# Patient Record
Sex: Female | Born: 1960 | Race: White | Hispanic: No | Marital: Married | Smoking: Current every day smoker
Health system: Southern US, Community
[De-identification: ages and names within clinical notes are randomized; demographics above are authoritative.]

## PROBLEM LIST (undated history)

## (undated) HISTORY — PX: TUBAL LIGATION: SHX77

## (undated) HISTORY — PX: OTHER SURGICAL HISTORY: SHX169

---

## 1998-01-27 ENCOUNTER — Encounter: Admission: RE | Admit: 1998-01-27 | Discharge: 1998-01-27 | Payer: Self-pay | Admitting: Family Medicine

## 1998-02-03 ENCOUNTER — Encounter: Admission: RE | Admit: 1998-02-03 | Discharge: 1998-02-03 | Payer: Self-pay | Admitting: Family Medicine

## 1998-04-08 ENCOUNTER — Emergency Department (HOSPITAL_COMMUNITY): Admission: EM | Admit: 1998-04-08 | Discharge: 1998-04-08 | Payer: Self-pay | Admitting: Emergency Medicine

## 1998-04-21 ENCOUNTER — Encounter: Payer: Self-pay | Admitting: Family Medicine

## 1998-04-21 ENCOUNTER — Encounter: Admission: RE | Admit: 1998-04-21 | Discharge: 1998-04-21 | Payer: Self-pay | Admitting: Family Medicine

## 1998-04-21 ENCOUNTER — Ambulatory Visit (HOSPITAL_COMMUNITY): Admission: RE | Admit: 1998-04-21 | Discharge: 1998-04-21 | Payer: Self-pay | Admitting: Family Medicine

## 1998-05-26 ENCOUNTER — Encounter: Admission: RE | Admit: 1998-05-26 | Discharge: 1998-05-26 | Payer: Self-pay | Admitting: Sports Medicine

## 1998-08-04 ENCOUNTER — Encounter: Admission: RE | Admit: 1998-08-04 | Discharge: 1998-08-04 | Payer: Self-pay | Admitting: Family Medicine

## 1998-08-11 ENCOUNTER — Encounter: Admission: RE | Admit: 1998-08-11 | Discharge: 1998-08-11 | Payer: Self-pay | Admitting: Sports Medicine

## 1999-01-19 ENCOUNTER — Encounter: Admission: RE | Admit: 1999-01-19 | Discharge: 1999-01-19 | Payer: Self-pay | Admitting: Family Medicine

## 1999-02-01 ENCOUNTER — Encounter: Admission: RE | Admit: 1999-02-01 | Discharge: 1999-02-01 | Payer: Self-pay | Admitting: Family Medicine

## 2002-12-05 ENCOUNTER — Emergency Department (HOSPITAL_COMMUNITY): Admission: EM | Admit: 2002-12-05 | Discharge: 2002-12-05 | Payer: Self-pay | Admitting: Emergency Medicine

## 2002-12-17 ENCOUNTER — Encounter: Admission: RE | Admit: 2002-12-17 | Discharge: 2002-12-17 | Payer: Self-pay | Admitting: Family Medicine

## 2003-01-07 ENCOUNTER — Encounter: Admission: RE | Admit: 2003-01-07 | Discharge: 2003-01-07 | Payer: Self-pay | Admitting: Family Medicine

## 2003-01-07 ENCOUNTER — Encounter: Payer: Self-pay | Admitting: Family Medicine

## 2004-03-23 ENCOUNTER — Encounter: Admission: RE | Admit: 2004-03-23 | Discharge: 2004-03-23 | Payer: Self-pay | Admitting: Family Medicine

## 2004-03-23 ENCOUNTER — Encounter: Payer: Self-pay | Admitting: Family Medicine

## 2005-11-29 ENCOUNTER — Encounter (INDEPENDENT_AMBULATORY_CARE_PROVIDER_SITE_OTHER): Payer: Self-pay | Admitting: *Deleted

## 2005-11-29 LAB — CONVERTED CEMR LAB

## 2005-12-27 ENCOUNTER — Ambulatory Visit: Payer: Self-pay | Admitting: Family Medicine

## 2006-03-07 ENCOUNTER — Ambulatory Visit: Payer: Self-pay | Admitting: Family Medicine

## 2006-03-29 ENCOUNTER — Encounter: Admission: RE | Admit: 2006-03-29 | Discharge: 2006-03-29 | Payer: Self-pay | Admitting: Family Medicine

## 2006-09-28 DIAGNOSIS — F17200 Nicotine dependence, unspecified, uncomplicated: Secondary | ICD-10-CM

## 2006-09-28 DIAGNOSIS — F79 Unspecified intellectual disabilities: Secondary | ICD-10-CM | POA: Insufficient documentation

## 2006-09-29 ENCOUNTER — Encounter (INDEPENDENT_AMBULATORY_CARE_PROVIDER_SITE_OTHER): Payer: Self-pay | Admitting: *Deleted

## 2006-10-09 ENCOUNTER — Telehealth: Payer: Self-pay | Admitting: *Deleted

## 2006-10-10 ENCOUNTER — Ambulatory Visit: Payer: Self-pay | Admitting: Family Medicine

## 2006-10-24 ENCOUNTER — Telehealth: Payer: Self-pay | Admitting: *Deleted

## 2006-11-01 ENCOUNTER — Encounter: Payer: Self-pay | Admitting: Family Medicine

## 2006-11-07 ENCOUNTER — Ambulatory Visit: Payer: Self-pay | Admitting: Family Medicine

## 2006-11-07 DIAGNOSIS — K029 Dental caries, unspecified: Secondary | ICD-10-CM | POA: Insufficient documentation

## 2006-11-07 DIAGNOSIS — M545 Low back pain: Secondary | ICD-10-CM

## 2006-12-12 ENCOUNTER — Ambulatory Visit: Payer: Self-pay | Admitting: Family Medicine

## 2007-11-27 ENCOUNTER — Ambulatory Visit: Payer: Self-pay | Admitting: Family Medicine

## 2007-11-27 DIAGNOSIS — R5383 Other fatigue: Secondary | ICD-10-CM

## 2007-11-27 DIAGNOSIS — R609 Edema, unspecified: Secondary | ICD-10-CM | POA: Insufficient documentation

## 2007-11-27 DIAGNOSIS — R5381 Other malaise: Secondary | ICD-10-CM | POA: Insufficient documentation

## 2007-11-27 LAB — CONVERTED CEMR LAB
ALT: 12 units/L (ref 0–35)
AST: 15 units/L (ref 0–37)
Albumin: 4.3 g/dL (ref 3.5–5.2)
Alkaline Phosphatase: 52 units/L (ref 39–117)
BUN: 11 mg/dL (ref 6–23)
Bilirubin Urine: NEGATIVE
Blood in Urine, dipstick: NEGATIVE
CO2: 22 meq/L (ref 19–32)
Calcium: 9 mg/dL (ref 8.4–10.5)
Chloride: 107 meq/L (ref 96–112)
Creatinine, Ser: 0.67 mg/dL (ref 0.40–1.20)
Direct LDL: 134 mg/dL — ABNORMAL HIGH
Glucose, Bld: 110 mg/dL — ABNORMAL HIGH (ref 70–99)
Glucose, Urine, Semiquant: NEGATIVE
HCT: 45.2 % (ref 36.0–46.0)
Hemoglobin: 14.7 g/dL (ref 12.0–15.0)
Ketones, urine, test strip: NEGATIVE
MCHC: 32.5 g/dL (ref 30.0–36.0)
MCV: 90.6 fL (ref 78.0–100.0)
Nitrite: NEGATIVE
Platelets: 259 10*3/uL (ref 150–400)
Potassium: 3.8 meq/L (ref 3.5–5.3)
Protein, U semiquant: NEGATIVE
RBC: 4.99 M/uL (ref 3.87–5.11)
RDW: 14.2 % (ref 11.5–15.5)
Sodium: 142 meq/L (ref 135–145)
Specific Gravity, Urine: <1.005
TSH: 1.813 microintl units/mL (ref 0.350–5.50)
Total Bilirubin: 1 mg/dL (ref 0.3–1.2)
Total Protein: 6.9 g/dL (ref 6.0–8.3)
Urobilinogen, UA: 0.2
WBC Urine, dipstick: NEGATIVE
WBC: 7.4 10*3/uL (ref 4.0–10.5)
pH: 5.5

## 2008-05-28 ENCOUNTER — Telehealth (INDEPENDENT_AMBULATORY_CARE_PROVIDER_SITE_OTHER): Payer: Self-pay | Admitting: *Deleted

## 2010-06-08 ENCOUNTER — Encounter: Payer: Self-pay | Admitting: Family Medicine

## 2010-06-08 ENCOUNTER — Ambulatory Visit (HOSPITAL_COMMUNITY): Admission: RE | Admit: 2010-06-08 | Discharge: 2010-06-08 | Payer: Self-pay | Admitting: Family Medicine

## 2010-06-08 ENCOUNTER — Ambulatory Visit: Payer: Self-pay | Admitting: Family Medicine

## 2010-06-08 DIAGNOSIS — M25559 Pain in unspecified hip: Secondary | ICD-10-CM

## 2010-06-08 DIAGNOSIS — R079 Chest pain, unspecified: Secondary | ICD-10-CM

## 2010-06-08 LAB — CONVERTED CEMR LAB: Pap Smear: NEGATIVE

## 2010-06-17 ENCOUNTER — Ambulatory Visit: Payer: Self-pay | Admitting: Family Medicine

## 2010-06-17 DIAGNOSIS — K645 Perianal venous thrombosis: Secondary | ICD-10-CM

## 2010-08-29 LAB — CONVERTED CEMR LAB
ALT: <8 units/L (ref 0–35)
AST: 13 units/L (ref 0–37)
Albumin: 4.3 g/dL (ref 3.5–5.2)
Alkaline Phosphatase: 53 units/L (ref 39–117)
BUN: 8 mg/dL (ref 6–23)
CO2: 25 meq/L (ref 19–32)
Calcium: 9.2 mg/dL (ref 8.4–10.5)
Chloride: 107 meq/L (ref 96–112)
Creatinine, Ser: 0.73 mg/dL (ref 0.40–1.20)
Direct LDL: 148 mg/dL — ABNORMAL HIGH
Glucose, Bld: 85 mg/dL (ref 70–99)
HCT: 43.4 % (ref 36.0–46.0)
Hemoglobin: 13.8 g/dL (ref 12.0–15.0)
MCHC: 31.8 g/dL (ref 30.0–36.0)
MCV: 93.7 fL (ref 78.0–100.0)
Platelets: 271 10*3/uL (ref 150–400)
Potassium: 3.8 meq/L (ref 3.5–5.3)
RBC: 4.63 M/uL (ref 3.87–5.11)
RDW: 14.6 % (ref 11.5–15.5)
Sodium: 143 meq/L (ref 135–145)
Total Bilirubin: 0.4 mg/dL (ref 0.3–1.2)
Total Protein: 6.6 g/dL (ref 6.0–8.3)
WBC: 9 10*3/uL (ref 4.0–10.5)

## 2010-08-31 NOTE — Assessment & Plan Note (Signed)
Summary: CPE/KH   Vital Signs:  Patient profile:   50 year old female Height:      59.5 inches Weight:      135 pounds Pulse rate:   64 / minute BP sitting:   116 / 80  (right arm)  Vitals Entered By: Arlyss Repress CMA, (June 08, 2010 1:34 PM) CC: mid chest pain x 2 days. swelling of extremities x few days. Is Patient Diabetic? No Pain Assessment Patient in pain? yes     Location: head Intensity: 10 Onset of pain  x 2 days   Primary Care Provider:  Zachery Dauer MD  CC:  mid chest pain x 2 days. swelling of extremities x few days.Marland Kitchen  History of Present Illness: Here for physical exam.  She reports chest pains that are not associated with activity that wake her up in the night, often present in the morning, lasting days.  Her boyfriend called and expressed worry and mentioned that he has had several heart attacks one recently.    She also talks about headaches, sometimes at the same time as the chest pain.  Her left hip hurts, and catches when she stands.  Her hands are puffy (this has been documented in the past as a chronic problem)  Patients is not a very good historian.  Habits & Providers  Alcohol-Tobacco-Diet     Tobacco Status: current     Tobacco Counseling: to quit use of tobacco products     Cigarette Packs/Day: 1.0  Current Medications (verified): 1)  Ranitidine Hcl 150 Mg Caps (Ranitidine Hcl) .... One Two Times A Day  Allergies (verified): No Known Drug Allergies  Social History: Packs/Day:  1.0  Review of Systems      See HPI  Physical Exam  General:  Looked well, low literacy, moved easily on and off exam table. Eyes:  pupils equal, pupils round, pupils reactive to light, no injection, no iris abnormalities, and no optic disk abnormalities.   Ears:  R ear normal and L ear normal.   Nose:  no external deformity, no external erythema, and no nasal discharge.   Mouth:  edentulous, no lesions Neck:  No deformities, masses, or tenderness  noted. Lungs:  normal respiratory effort and normal breath sounds.   Heart:  normal rate, regular rhythm, and no murmur.    EKG-brady sinus rhythmn Abdomen:  soft, non-tender, normal bowel sounds, no distention, and no masses.   Rectal:  peri-anal reddness, few tags Genitalia:  normal introitus, no external lesions, no vaginal discharge, mucosa pink and moist, no vaginal or cervical lesions, no friaility or hemorrhage, and normal uterus size and position.  anterior vaginal wall weakness with palpation of bladder easily. Msk:  boggy hands, no joint reddness.  Full ROM of UE.  Left hip rotation produced left groin pain.  Right hip rotation without complaint of pain. Neurologic:  cranial nerves II-XII intact, strength normal in all extremities, and gait normal.   Skin:  no suspicious lesions Psych:  low intellictual functioning, happy affect.   Impression & Recommendations:  Problem # 1:  HEALTH MAINTENANCE EXAM (ICD-V70.0) LDL 148, flu shot given, Mammogram due-will address at next visit. Orders: [facility] - Est  40-64 yrs (16109)  Problem # 2:  HIP PAIN, LEFT (ICD-719.45) groin pain, consider Xray if continues to be a problem  Problem # 3:  CHEST PAIN (ICD-786.50) did not fit the picture of cardiac, rather sounded more GI.  Fist step is to add antacid (ranitadine) and  follow up in one month. Orders: 12 Lead EKG (12 Lead EKG) Comp Met-[facility] (29562-13086) Direct LDL-[facility] (57846-96295) CBC-[facility] (28413) [facility] - Est  40-64 yrs (24401)  Complete Medication List: 1)  Ranitidine Hcl 150 Mg Caps (Ranitidine hcl) .... One two times a day  Other Orders: Pap Smear-[facility] (02725-36644) Influenza Vaccine NON MCR (03474)  Patient Instructions: 1)  I think your chest pain is coming from your stomach, take the medicine as prescribed and return in one month with Dr. Sheffield Slider 2)  Your EKG for your heart looked good Prescriptions: RANITIDINE HCL 150 MG CAPS (RANITIDINE HCL) one two times a day Brand medically  necessary #60 x 3   Entered and Authorized by:   Luretha Murphy NP   Signed by:   Luretha Murphy NP on 06/08/2010   Method used:   Print then Give to Patient   RxID:   [id number]    Orders Added: 1)  12 Lead EKG [12 Lead EKG] 2)  Comp Met-[facility] [18841-66063] 3)  Direct LDL-[facility] [83721-81033] 4)  CBC-[facility] [85027] 5)  Pap Smear-[facility] [01601-09323] 6)  Influenza Vaccine NON MCR [00028] 7)  [facility] - Est  40-64 yrs [99396]   Immunizations Administered:  Influenza Vaccine # 1:    Vaccine Type: Fluvax Non-MCR    Site: left deltoid    Mfr: GlaxoSmithKline    Dose: 0.5 ml    Route: IM    Given by: Arlyss Repress CMA,    Exp. Date: 01/29/2011    Lot #: [id number]    VIS given: 02/23/10 version given June 08, 2010.  Flu Vaccine Consent Questions:    Do you have a history of severe allergic reactions to this vaccine? no    Any prior history of allergic reactions to egg and/or gelatin? no    Do you have a sensitivity to the preservative Thimersol? no    Do you have a past history of Guillan-Barre Syndrome? no    Do you currently have an acute febrile illness? no    Have you ever had a severe reaction to latex? no    Vaccine information given and explained to patient? yes    Are you currently pregnant? no   Immunizations Administered:  Influenza Vaccine # 1:    Vaccine Type: Fluvax Non-MCR    Site: left deltoid    Mfr: GlaxoSmithKline    Dose: 0.5 ml    Route: IM    Given by: Arlyss Repress CMA,    Exp. Date: 01/29/2011    Lot #: [id number]    VIS given: 02/23/10 version given June 08, 2010.   Prevention & Chronic Care Immunizations   Influenza vaccine: Fluvax Non-MCR  (06/08/2010)    Tetanus booster: 11/29/2005: Done.   Tetanus booster due: 11/30/2015    Pneumococcal vaccine: Not documented  Other Screening   Pap smear: Done.  (11/29/2005)   Pap smear action/deferral: Ordered  (06/08/2010)   Pap smear due: 11/29/2008    Mammogram: Done.  (03/01/2006)   Mammogram  due: Refused  (11/27/2007)   Smoking status: current  (06/08/2010)   Smoking cessation counseling: yes  (11/27/2007)  Lipids   Total Cholesterol: Not documented   LDL: Not documented   LDL Direct: 134  (11/27/2007)   HDL: Not documented   Triglycerides: Not documented   Nursing Instructions: Pap smear today

## 2010-08-31 NOTE — Assessment & Plan Note (Signed)
Summary: painful knot on bottom,df   Vital Signs:  Patient profile:   50 year old female Height:      59.5 inches Weight:      135.8 pounds BMI:     27.07 Temp:     98.7 degrees F oral Pulse rate:   72 / minute BP sitting:   118 / 80  (right arm)  Vitals Entered By: Renato Battles slade,cma CC: check bottom. boil? Is Patient Diabetic? No Pain Assessment Patient in pain? yes     Location: bottom Intensity: 10 Onset of pain  x few days   Primary Care Provider:  Zachery Dauer MD  CC:  check bottom. boil?Marland Kitchen  History of Present Illness: 50 year old female presents to clinic for evaulation lesion on rectum.  She has a history of external hemorrhoids which she says have been opened in the past.  Lesion is painful x 3 days and interferes with sitting and sleep.  Has not taken any medications for pain.  Still c/o intermittent chest pain which was evaulated at last visit, last occurred 6 days ago.  EKG was normal except for sinus brady.  Patient currently taking Ranitidine, and denies any abdominal pain, reflux, burning sensation in throat.  She has seen small amount of blood on the toilet paper. Hasn't been constipated.    Habits & Providers  Alcohol-Tobacco-Diet     Tobacco Status: current     Tobacco Counseling: to quit use of tobacco products  Current Medications (verified): 1)  Ranitidine Hcl 150 Mg Caps (Ranitidine Hcl) .... One Two Times A Day  Allergies (verified): No Known Drug Allergies  Review of Systems  The patient denies severe indigestion/heartburn.         Endorses rectal pain and intermittent chest pain.    Physical Exam  General:  Looked well, low literacy, moved easily on and off exam table. Lungs:  normal respiratory effort and normal breath sounds.   Heart:  normal rate, regular rhythm, and no murmur.     Abdomen:  soft, non-tender, normal bowel sounds, no distention, and no masses.   Rectal:  2 cm thrombosed external hemorrhoid measuring  ~1.5cm wide, R side   tenderness, normal sphincter tone, no anal fissures or fistulae or abscesses.  Anoscopy did not show int hemorrhoids or bleeding.    Impression & Recommendations:  Problem # 1:  EXTERNAL THROMBOSED HEMORRHOIDS (ICD-455.4) Discussed with patient the extent of her external hemorrhoids.  It was not necessary to lance it at this time.  Patient does not receive medicaid coverage until December 1st, so unable to fill prescriptions.  Advised patient to soak hand towels in hot water and appy to affected area, purchase OTC Tylenol to alleviate the pain, and purchase OTC Hydrocortisone 1% cream to affected area 2-3 times a day.  Patient says she will purchase items when she can afford it.  Will follow up in 1 month to re-evaluate.    Orders: [facility]- Est Level  3 (40347)  Complete Medication List: 1)  Ranitidine Hcl 150 Mg Caps (Ranitidine hcl) .... One two times a day  Patient Instructions: 1)  It was good to meet you today. 2)  It appears you have a thrombosed external hemorrhoid. 3)  Purchase over the counter Hydrocortisone 1% cream. 4)  Apply to affected area twice a day until pain improves. 5)  Soak a hand towel in hot water and apply to affected area three times a day until pain improves. 6)  Purchase Tylenol over  the counter and take 1 tablet every 8 hours as needed for pain. 7)  Please call MD the pain becomes worse or if you have any other concerns. 8)  Please schedule a follow-up appointment in 1 month.  9)  Thank you.   Orders Added: 1)  [facility]- Est Level  3 [16109]

## 2010-09-22 ENCOUNTER — Encounter: Payer: Self-pay | Admitting: *Deleted

## 2011-03-05 ENCOUNTER — Encounter: Payer: Self-pay | Admitting: Family Medicine

## 2011-03-08 ENCOUNTER — Encounter: Payer: Self-pay | Admitting: Family Medicine

## 2011-03-08 ENCOUNTER — Ambulatory Visit (INDEPENDENT_AMBULATORY_CARE_PROVIDER_SITE_OTHER): Payer: Medicare Other | Admitting: Family Medicine

## 2011-03-08 VITALS — BP 138/86 | Temp 98.1°F | Ht 59.5 in | Wt 140.0 lb

## 2011-03-08 DIAGNOSIS — R5381 Other malaise: Secondary | ICD-10-CM

## 2011-03-08 DIAGNOSIS — K2970 Gastritis, unspecified, without bleeding: Secondary | ICD-10-CM

## 2011-03-08 DIAGNOSIS — F17200 Nicotine dependence, unspecified, uncomplicated: Secondary | ICD-10-CM

## 2011-03-08 DIAGNOSIS — K029 Dental caries, unspecified: Secondary | ICD-10-CM

## 2011-03-08 DIAGNOSIS — M25559 Pain in unspecified hip: Secondary | ICD-10-CM

## 2011-03-08 DIAGNOSIS — K2990 Gastroduodenitis, unspecified, without bleeding: Secondary | ICD-10-CM

## 2011-03-08 DIAGNOSIS — M545 Low back pain: Secondary | ICD-10-CM

## 2011-03-08 DIAGNOSIS — G44229 Chronic tension-type headache, not intractable: Secondary | ICD-10-CM

## 2011-03-08 DIAGNOSIS — R5383 Other fatigue: Secondary | ICD-10-CM

## 2011-03-08 MED ORDER — BUPROPION HCL 75 MG PO TABS: 150.0000 mg | ORAL_TABLET | Freq: Two times a day (BID) | ORAL | Status: DC

## 2011-03-08 NOTE — Assessment & Plan Note (Signed)
Full dental extractions

## 2011-03-08 NOTE — Patient Instructions (Addendum)
Take Acetaminophen as needed for back pain.  You plan to stop smoking in 1 week.   Take the Bupropion for depression and to help you sleep.   Please come back to see Dr Sheffield Slider in 2 weeks

## 2011-03-09 ENCOUNTER — Telehealth: Payer: Self-pay | Admitting: *Deleted

## 2011-03-09 MED ORDER — BUPROPION HCL ER (SR) 100 MG PO TB12: 100.0000 mg | ORAL_TABLET | Freq: Two times a day (BID) | ORAL | Status: DC

## 2011-03-09 NOTE — Telephone Encounter (Signed)
I redid the Bupropion to simplify it due to her MR. I faxed the change back to her pharmacist. She gets upset because she can't understand

## 2011-03-09 NOTE — Telephone Encounter (Signed)
Called pt..explained to pt that the meds were sent yesterday by dr.hale, but i just called in the wellbutrin to her pharmacy. Tylenol is otc.  Advised pt to call her pharmacy before picking up meds and before driving there. If she still has problems, please call back. Pt was yelling and very upset. Explained 3 x the above. Lorenda Hatchet, Renato Battles

## 2011-03-10 NOTE — Progress Notes (Signed)
  Subjective:    Patient ID: Deanna Mcmahon, female    DOB: 05-27-1961, 50 y.o.   MRN: [id number]  HPIHeadache this past week with no neurologic symptoms. Could use glasses but doesn't have them.   Has trouble going to sleep and awakening early in the AM.   Menses are irregular, hot flashes at times.   Wonders if she has ulcers. Not taking stomach pills now  Back pain off and on for a long time. No recent injury or leg symptoms except for burning in her feet  Would like to quit smoking as would her boy friend Jonny Ruiz. He will quit cold [place]   Level 5 Caveat Limited communication due to her mental retardation.  Review of Systems     Objective:   Physical Exam  Constitutional: She appears well-nourished.  Eyes: Conjunctivae and EOM are normal. Pupils are equal, round, and reactive to light.       R fundus flat disk, left not well seen due to poor cooperation   Cardiovascular: Normal rate and regular rhythm.   Pulmonary/Chest: Effort normal and breath sounds normal.  Abdominal: Soft. Bowel sounds are normal.  Musculoskeletal: Normal range of motion.       Bends forward to touch toes without apparent pain and no limitation of range of motion  Negative straight leg-raise    Neurological: She is alert. She displays normal reflexes. No cranial nerve deficit. Coordination normal.  Psychiatric:       She seemed minimally concerned and speaks with her usual poor grammar and concrete descriptions.              Assessment & Plan:  She needs to have colonoscopy and mammogram, but her capacity to follow instructions is limited. Will start with trying to help her quit smoking.

## 2011-03-10 NOTE — Assessment & Plan Note (Signed)
No indication of CNS problem.

## 2011-03-10 NOTE — Assessment & Plan Note (Signed)
improved

## 2011-03-10 NOTE — Assessment & Plan Note (Signed)
May be how she is describing depression

## 2011-03-10 NOTE — Assessment & Plan Note (Signed)
Musculoskeletal without signs of spasm

## 2011-03-11 DIAGNOSIS — K2970 Gastritis, unspecified, without bleeding: Secondary | ICD-10-CM | POA: Insufficient documentation

## 2011-03-11 NOTE — Assessment & Plan Note (Signed)
Should stop smoking and take Ranitidine

## 2011-06-01 ENCOUNTER — Emergency Department (HOSPITAL_COMMUNITY)
Admission: EM | Admit: 2011-06-01 | Discharge: 2011-06-01 | Disposition: A | Discharge status: Home / Self Care | Payer: Medicare Other | Attending: Emergency Medicine | Admitting: Emergency Medicine

## 2011-06-01 DIAGNOSIS — S1093XA Contusion of unspecified part of neck, initial encounter: Secondary | ICD-10-CM | POA: Insufficient documentation

## 2011-06-01 DIAGNOSIS — F411 Generalized anxiety disorder: Secondary | ICD-10-CM | POA: Insufficient documentation

## 2011-06-01 DIAGNOSIS — S0003XA Contusion of scalp, initial encounter: Secondary | ICD-10-CM | POA: Insufficient documentation

## 2012-01-10 ENCOUNTER — Ambulatory Visit (INDEPENDENT_AMBULATORY_CARE_PROVIDER_SITE_OTHER): Payer: Medicare Other | Admitting: Family Medicine

## 2012-01-10 ENCOUNTER — Encounter: Payer: Self-pay | Admitting: Family Medicine

## 2012-01-10 VITALS — BP 123/80 | HR 78 | Ht 59.5 in | Wt 143.0 lb

## 2012-01-10 DIAGNOSIS — S63502A Unspecified sprain of left wrist, initial encounter: Secondary | ICD-10-CM

## 2012-01-10 DIAGNOSIS — F79 Unspecified intellectual disabilities: Secondary | ICD-10-CM

## 2012-01-10 DIAGNOSIS — R5383 Other fatigue: Secondary | ICD-10-CM

## 2012-01-10 DIAGNOSIS — F17200 Nicotine dependence, unspecified, uncomplicated: Secondary | ICD-10-CM

## 2012-01-10 DIAGNOSIS — G44229 Chronic tension-type headache, not intractable: Secondary | ICD-10-CM

## 2012-01-10 DIAGNOSIS — R5381 Other malaise: Secondary | ICD-10-CM

## 2012-01-10 DIAGNOSIS — S63509A Unspecified sprain of unspecified wrist, initial encounter: Secondary | ICD-10-CM

## 2012-01-10 MED ORDER — BUPROPION HCL ER (SR) 100 MG PO TB12: 100.0000 mg | ORAL_TABLET | Freq: Two times a day (BID) | ORAL | Status: AC

## 2012-01-10 MED ORDER — BUPROPION HCL ER (SR) 100 MG PO TB12: 100.0000 mg | ORAL_TABLET | Freq: Two times a day (BID) | ORAL | Status: DC

## 2012-01-10 MED ORDER — IBUPROFEN 600 MG PO TABS: 600.0000 mg | ORAL_TABLET | Freq: Three times a day (TID) | ORAL | Status: AC | PRN

## 2012-01-10 MED ORDER — IBUPROFEN 600 MG PO TABS: 600.0000 mg | ORAL_TABLET | Freq: Three times a day (TID) | ORAL | Status: DC | PRN

## 2012-01-10 NOTE — Patient Instructions (Signed)
For your wrist sprain, wear the splint as long as it's comfortable  Take the Ibuprofen with food for at least 3 days  Restart Buproprion for your nerves and to help quit smoking.   Schedule an appointment with Dr Sheffield Slider to help with quitting smoking when you are ready.

## 2012-01-10 NOTE — Assessment & Plan Note (Signed)
Intellectual function stable

## 2012-01-10 NOTE — Assessment & Plan Note (Signed)
Mild, will splint and treat with Ibuprofen with meals

## 2012-01-10 NOTE — Progress Notes (Signed)
  Subjective:    Patient ID: Deanna Mcmahon, female    DOB: 18-Sep-1960, 51 y.o.   MRN: [id number]  HPI left wrist hurts x 3 days since twisted it. No fall. Increased with turning knob, writing name  Headaches x 3 months, frontal, takes Tylenol, helps   Refuses mammogram or tests for colon cancer.   She reports that her aunt says she needs to restart the medication that makes her less irritable. Review of Systems     Objective:   Physical Exam  Constitutional: She appears well-developed and well-nourished.  Eyes: Conjunctivae and EOM are normal. Pupils are equal, round, and reactive to light.       Fundi benign. Flat right disk  Cardiovascular: Normal rate and regular rhythm.   Pulmonary/Chest: Effort normal and breath sounds normal.  Musculoskeletal:       Stubby puffy hands as usual. No focal wrist pain or limitation of hand or wrist movement. No contusion or crepitus  Neurological: She is alert.  Psychiatric: She has a normal mood and affect. Her behavior is normal.          Assessment & Plan:

## 2012-01-10 NOTE — Assessment & Plan Note (Signed)
Thinking about quitting due to the cost. Boyfriend, awaiting heart transplant, also smokes

## 2012-01-10 NOTE — Assessment & Plan Note (Signed)
No focal signs or indications of increased intracranial pressure

## 2012-01-31 ENCOUNTER — Other Ambulatory Visit: Payer: Self-pay | Admitting: Family Medicine

## 2013-12-03 ENCOUNTER — Other Ambulatory Visit (HOSPITAL_COMMUNITY): Payer: Self-pay | Admitting: Family Medicine

## 2013-12-03 ENCOUNTER — Ambulatory Visit (HOSPITAL_COMMUNITY)
Admission: RE | Admit: 2013-12-03 | Discharge: 2013-12-03 | Disposition: A | Discharge status: Home / Self Care | Payer: Medicare HMO | Source: Ambulatory Visit | Attending: Family Medicine | Admitting: Family Medicine

## 2013-12-03 DIAGNOSIS — M25569 Pain in unspecified knee: Secondary | ICD-10-CM | POA: Diagnosis present

## 2013-12-03 DIAGNOSIS — M79609 Pain in unspecified limb: Secondary | ICD-10-CM | POA: Insufficient documentation

## 2013-12-03 DIAGNOSIS — W19XXXA Unspecified fall, initial encounter: Secondary | ICD-10-CM

## 2014-06-02 ENCOUNTER — Encounter: Payer: Self-pay | Admitting: Family Medicine

## 2014-06-24 ENCOUNTER — Emergency Department (HOSPITAL_COMMUNITY)
Admission: EM | Admit: 2014-06-24 | Discharge: 2014-06-24 | Disposition: A | Discharge status: Home / Self Care | Payer: Medicare HMO | Attending: Emergency Medicine | Admitting: Emergency Medicine

## 2014-06-24 ENCOUNTER — Emergency Department (HOSPITAL_COMMUNITY): Payer: Medicare HMO

## 2014-06-24 ENCOUNTER — Encounter (HOSPITAL_COMMUNITY): Payer: Self-pay | Admitting: Emergency Medicine

## 2014-06-24 DIAGNOSIS — Z79899 Other long term (current) drug therapy: Secondary | ICD-10-CM | POA: Diagnosis not present

## 2014-06-24 DIAGNOSIS — Z880 Allergy status to penicillin: Secondary | ICD-10-CM | POA: Insufficient documentation

## 2014-06-24 DIAGNOSIS — M79671 Pain in right foot: Secondary | ICD-10-CM | POA: Insufficient documentation

## 2014-06-24 DIAGNOSIS — R2241 Localized swelling, mass and lump, right lower limb: Secondary | ICD-10-CM | POA: Diagnosis present

## 2014-06-24 DIAGNOSIS — Z720 Tobacco use: Secondary | ICD-10-CM | POA: Diagnosis not present

## 2014-06-24 MED ORDER — IBUPROFEN 400 MG PO TABS: 400.0000 mg | ORAL_TABLET | ORAL | Status: AC | PRN

## 2014-06-24 MED ORDER — IBUPROFEN 200 MG PO TABS
600.0000 mg | ORAL_TABLET | Freq: Once | ORAL | Status: AC
  Administered 2014-06-24: 600 mg via ORAL
  Filled 2014-06-24: qty 3
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
## 2014-06-24 NOTE — Discharge Instructions (Signed)
-  You have some mild arthritis in your big toe, but no fractures to explain your pain. -It is likely that your pain is due to a muscle strain or inflammation. -You can take ibuprofen 400 mg every 4 hours as needed to help with the pain and inflammation. -I gave you the contact information for a podiatrist who you can follow up with if your pain does not improve.

## 2014-06-24 NOTE — ED Notes (Addendum)
Pt reports right sided foot pain and swelling. Pt reports been going on for 4 mos. Hurts worse with weight bearing; no major swelling noted by triage RN.

## 2014-10-02 DIAGNOSIS — F17201 Nicotine dependence, unspecified, in remission: Secondary | ICD-10-CM | POA: Diagnosis not present

## 2014-10-02 DIAGNOSIS — I10 Essential (primary) hypertension: Secondary | ICD-10-CM | POA: Diagnosis not present

## 2014-10-02 DIAGNOSIS — M25569 Pain in unspecified knee: Secondary | ICD-10-CM | POA: Diagnosis not present

## 2014-10-21 ENCOUNTER — Ambulatory Visit (INDEPENDENT_AMBULATORY_CARE_PROVIDER_SITE_OTHER): Payer: Commercial Managed Care - HMO

## 2014-10-21 ENCOUNTER — Ambulatory Visit (INDEPENDENT_AMBULATORY_CARE_PROVIDER_SITE_OTHER): Payer: Commercial Managed Care - HMO | Admitting: Podiatry

## 2014-10-21 ENCOUNTER — Encounter: Payer: Self-pay | Admitting: Podiatry

## 2014-10-21 ENCOUNTER — Other Ambulatory Visit: Payer: Self-pay | Admitting: Podiatry

## 2014-10-21 VITALS — BP 108/81 | HR 113 | Resp 16

## 2014-10-21 DIAGNOSIS — M7750 Other enthesopathy of unspecified foot: Secondary | ICD-10-CM

## 2014-10-21 DIAGNOSIS — M79672 Pain in left foot: Secondary | ICD-10-CM

## 2014-10-21 DIAGNOSIS — M6588 Other synovitis and tenosynovitis, other site: Secondary | ICD-10-CM

## 2014-10-21 MED ORDER — TRAMADOL HCL 50 MG PO TABS: ORAL_TABLET | ORAL | Status: AC
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
## 2014-11-03 ENCOUNTER — Other Ambulatory Visit: Payer: Self-pay

## 2014-11-03 DIAGNOSIS — Z1231 Encounter for screening mammogram for malignant neoplasm of breast: Secondary | ICD-10-CM

## 2014-11-03 DIAGNOSIS — F17201 Nicotine dependence, unspecified, in remission: Secondary | ICD-10-CM | POA: Diagnosis not present

## 2014-11-03 DIAGNOSIS — F79 Unspecified intellectual disabilities: Secondary | ICD-10-CM | POA: Diagnosis not present

## 2014-11-18 ENCOUNTER — Ambulatory Visit (INDEPENDENT_AMBULATORY_CARE_PROVIDER_SITE_OTHER): Payer: Commercial Managed Care - HMO | Admitting: Podiatry

## 2014-11-18 VITALS — BP 100/90 | HR 100 | Resp 14

## 2014-11-18 DIAGNOSIS — M7750 Other enthesopathy of unspecified foot: Secondary | ICD-10-CM

## 2014-11-18 DIAGNOSIS — M6588 Other synovitis and tenosynovitis, other site: Secondary | ICD-10-CM | POA: Diagnosis not present

## 2014-11-19 NOTE — Progress Notes (Signed)
She presents today for follow-up of her right ankle sprain. She states that she's doing much better.  Objective: Vital signs are stable she is alert and oriented 3 is no pain on palpation of the ankle joint.  Assessment: Slow healing sprained foot and ankle right.  Plan: Follow up with me as needed.

## 2014-12-09 ENCOUNTER — Ambulatory Visit: Payer: Medicare HMO

## 2015-05-07 DIAGNOSIS — Z0000 Encounter for general adult medical examination without abnormal findings: Secondary | ICD-10-CM | POA: Diagnosis not present

## 2015-08-17 ENCOUNTER — Emergency Department (HOSPITAL_COMMUNITY): Payer: Medicare HMO

## 2015-08-17 ENCOUNTER — Encounter (HOSPITAL_COMMUNITY): Payer: Self-pay | Admitting: Family Medicine

## 2015-08-17 ENCOUNTER — Emergency Department (HOSPITAL_COMMUNITY)
Admission: EM | Admit: 2015-08-17 | Discharge: 2015-08-17 | Disposition: A | Discharge status: Home / Self Care | Payer: Medicare HMO | Attending: Emergency Medicine | Admitting: Emergency Medicine

## 2015-08-17 DIAGNOSIS — R05 Cough: Secondary | ICD-10-CM | POA: Diagnosis not present

## 2015-08-17 DIAGNOSIS — Z880 Allergy status to penicillin: Secondary | ICD-10-CM | POA: Insufficient documentation

## 2015-08-17 DIAGNOSIS — K08109 Complete loss of teeth, unspecified cause, unspecified class: Secondary | ICD-10-CM | POA: Diagnosis not present

## 2015-08-17 DIAGNOSIS — J209 Acute bronchitis, unspecified: Secondary | ICD-10-CM | POA: Insufficient documentation

## 2015-08-17 DIAGNOSIS — F17210 Nicotine dependence, cigarettes, uncomplicated: Secondary | ICD-10-CM | POA: Diagnosis not present

## 2015-08-17 DIAGNOSIS — R059 Cough, unspecified: Secondary | ICD-10-CM

## 2015-08-17 DIAGNOSIS — F17200 Nicotine dependence, unspecified, uncomplicated: Secondary | ICD-10-CM

## 2015-08-17 DIAGNOSIS — J40 Bronchitis, not specified as acute or chronic: Secondary | ICD-10-CM | POA: Diagnosis not present

## 2015-08-17 DIAGNOSIS — F17219 Nicotine dependence, cigarettes, with unspecified nicotine-induced disorders: Secondary | ICD-10-CM | POA: Diagnosis not present

## 2015-08-17 DIAGNOSIS — Z79899 Other long term (current) drug therapy: Secondary | ICD-10-CM | POA: Diagnosis not present

## 2015-08-17 MED ORDER — ALBUTEROL SULFATE HFA 108 (90 BASE) MCG/ACT IN AERS: 2.0000 | INHALATION_SPRAY | RESPIRATORY_TRACT | Status: AC | PRN

## 2015-08-17 MED ORDER — PREDNISONE 50 MG PO TABS: ORAL_TABLET | ORAL | Status: DC

## 2015-08-17 MED ORDER — AZITHROMYCIN 250 MG PO TABS: ORAL_TABLET | ORAL | Status: DC

## 2015-08-17 MED ORDER — HYDROCODONE-ACETAMINOPHEN 5-325 MG PO TABS: ORAL_TABLET | ORAL | Status: AC

## 2015-08-17 NOTE — ED Notes (Signed)
Pt here for cough and URI symptoms. sts her husband recently had the same.

## 2015-08-17 NOTE — ED Notes (Signed)
Declined W/C at D/C and was escorted to lobby by RN. 

## 2015-08-17 NOTE — Discharge Instructions (Signed)
Take vicodin for breakthrough pain, do not drink alcohol, drive, care for children or do other critical tasks while taking vicodin. ° °Please follow with your primary care doctor in the next 2 days for a check-up. They must obtain records for further management.  ° °Do not hesitate to return to the Emergency Department for any new, worsening or concerning symptoms.  ° °

## 2015-08-17 NOTE — ED Notes (Signed)
Se PA assessment
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
## 2015-08-28 DIAGNOSIS — F17200 Nicotine dependence, unspecified, uncomplicated: Secondary | ICD-10-CM | POA: Diagnosis not present

## 2015-08-28 DIAGNOSIS — I10 Essential (primary) hypertension: Secondary | ICD-10-CM | POA: Diagnosis not present

## 2015-08-28 DIAGNOSIS — R7309 Other abnormal glucose: Secondary | ICD-10-CM | POA: Diagnosis not present

## 2015-08-28 DIAGNOSIS — Z6826 Body mass index (BMI) 26.0-26.9, adult: Secondary | ICD-10-CM | POA: Diagnosis not present

## 2016-03-19 IMAGING — DX DG CHEST 2V
2 series · 2 of 2 positions shown · non-contrast
Comparison: None.

CLINICAL DATA: Cough and upper respiratory tract infections for 1
week.

EXAM:
CHEST  2 VIEW

[w chest pa]
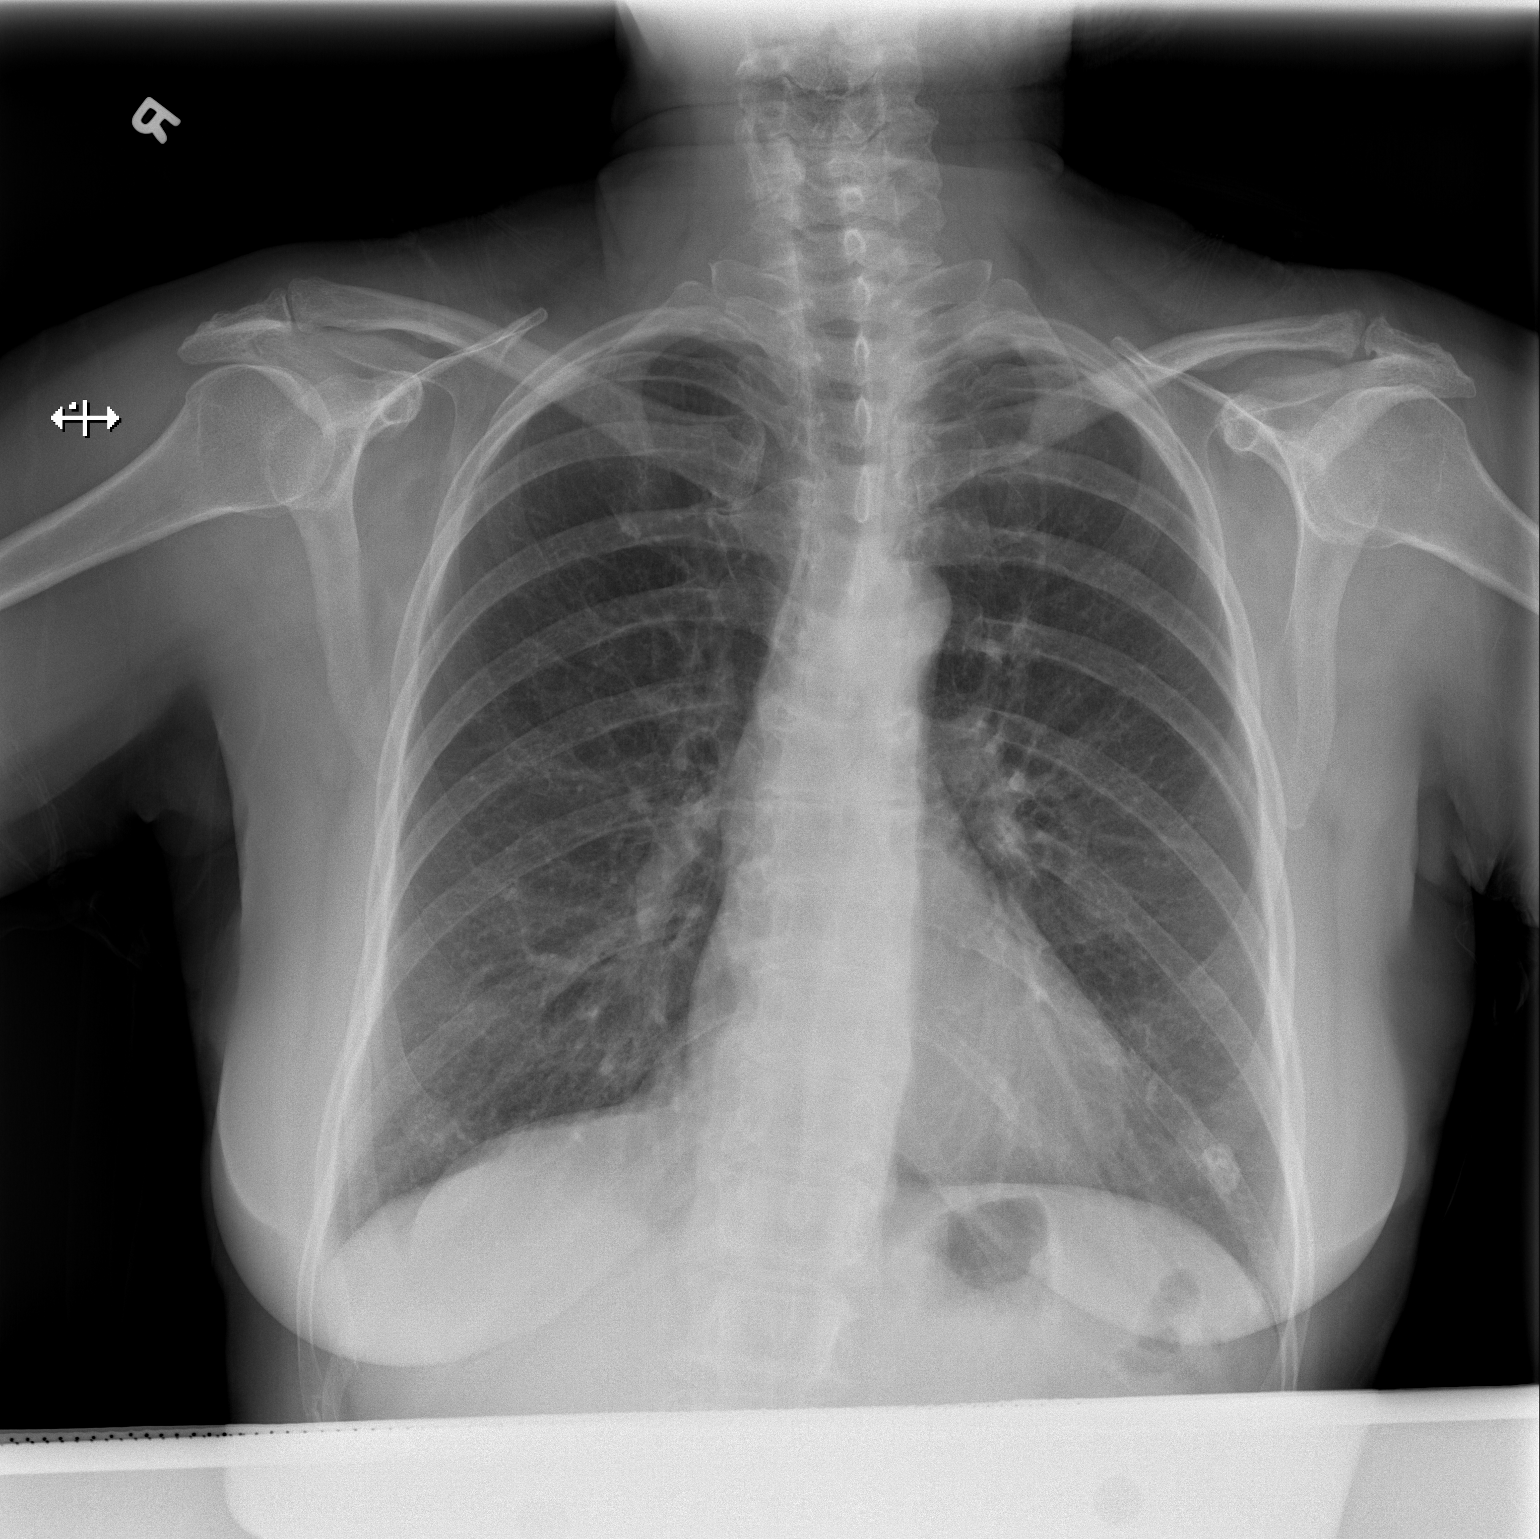

[w chest lat]
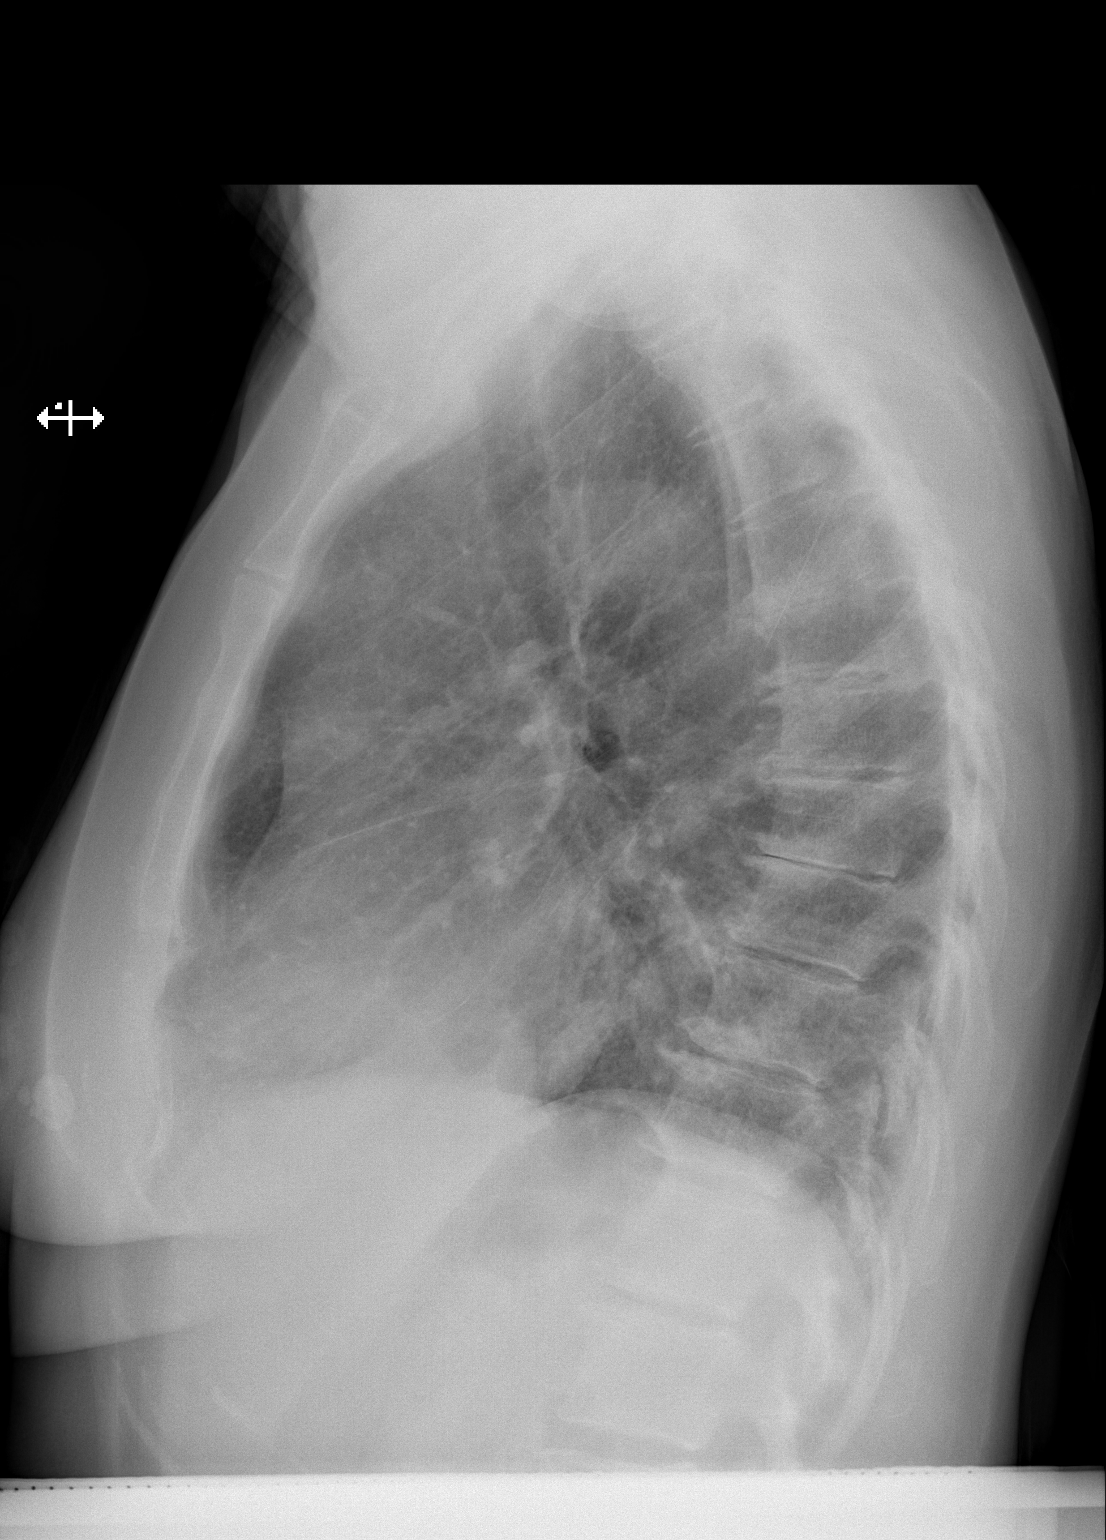

[2 of 2 positions shown; findings below may reference images not displayed]

FINDINGS: The lungs are clear. Heart size is normal. No pneumothorax or
pleural effusion. Coarse calcification in the left breast is
incidentally noted.
IMPRESSION: No acute disease.

## 2016-09-07 DIAGNOSIS — H353131 Nonexudative age-related macular degeneration, bilateral, early dry stage: Secondary | ICD-10-CM | POA: Diagnosis not present

## 2016-09-07 DIAGNOSIS — H2513 Age-related nuclear cataract, bilateral: Secondary | ICD-10-CM | POA: Diagnosis not present

## 2017-04-22 ENCOUNTER — Emergency Department (HOSPITAL_COMMUNITY)
Admission: EM | Admit: 2017-04-22 | Discharge: 2017-04-22 | Disposition: A | Discharge status: Home / Self Care | Payer: Medicare HMO | Attending: Emergency Medicine | Admitting: Emergency Medicine

## 2017-04-22 ENCOUNTER — Encounter (HOSPITAL_COMMUNITY): Payer: Self-pay | Admitting: Emergency Medicine

## 2017-04-22 ENCOUNTER — Emergency Department (HOSPITAL_COMMUNITY): Payer: Medicare HMO

## 2017-04-22 DIAGNOSIS — R069 Unspecified abnormalities of breathing: Secondary | ICD-10-CM | POA: Diagnosis not present

## 2017-04-22 DIAGNOSIS — F17210 Nicotine dependence, cigarettes, uncomplicated: Secondary | ICD-10-CM | POA: Insufficient documentation

## 2017-04-22 DIAGNOSIS — Z880 Allergy status to penicillin: Secondary | ICD-10-CM | POA: Diagnosis not present

## 2017-04-22 DIAGNOSIS — R0602 Shortness of breath: Secondary | ICD-10-CM | POA: Diagnosis not present

## 2017-04-22 DIAGNOSIS — J45909 Unspecified asthma, uncomplicated: Secondary | ICD-10-CM | POA: Insufficient documentation

## 2017-04-22 DIAGNOSIS — F79 Unspecified intellectual disabilities: Secondary | ICD-10-CM | POA: Insufficient documentation

## 2017-04-22 DIAGNOSIS — J449 Chronic obstructive pulmonary disease, unspecified: Secondary | ICD-10-CM | POA: Diagnosis not present

## 2017-04-22 DIAGNOSIS — R05 Cough: Secondary | ICD-10-CM | POA: Diagnosis not present

## 2017-04-22 MED ORDER — PREDNISONE 10 MG PO TABS: 20.0000 mg | ORAL_TABLET | Freq: Every day | ORAL | 0 refills | Status: AC

## 2017-04-22 MED ORDER — ALBUTEROL SULFATE HFA 108 (90 BASE) MCG/ACT IN AERS
1.0000 | INHALATION_SPRAY | Freq: Once | RESPIRATORY_TRACT | Status: AC
  Administered 2017-04-22: 1 via RESPIRATORY_TRACT
  Filled 2017-04-22: qty 6.7

## 2017-04-22 MED ORDER — PREDNISONE 20 MG PO TABS
60.0000 mg | ORAL_TABLET | Freq: Once | ORAL | Status: AC
  Administered 2017-04-22: 60 mg via ORAL
  Filled 2017-04-22: qty 3
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
## 2017-04-22 NOTE — Discharge Instructions (Addendum)
Take prednisone as prescribed.  Use inhaler as needed for shortness pf breath or wheezing. If you need to use it more than every 4 hours, follow up with your doctor or at the ER.  It is very important that you follow up with your doctor for management of your lungs.  Return to the ER if you have worsening shortness of breath, fevers, chills, or any new or worsening symptoms.  It is very important that you stop smoking.    Pharmacy Delivers Prescriptions to [facility] [address] [place], [place] [address] Phone: [phone] Fax: [phone]  [facility] [address], [place] [address] [phone] fax [phone] M-F 9a-7p Sat 10a-3 pm Sunday closed
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
## 2017-04-22 NOTE — Care Management Note (Signed)
Case Management Note  Patient Details  Name: Deanna Mcmahon MRN: [id number] Date of Birth: [birth year]  Subjective/Objective:   COPD, asthma                 Action/Plan: Discharge Planning: NCM spoke to family via spoke. States she lives in home with her boyfriend. States her copay for her medications are $3 but once she pays her bills she does not have enough to pay copay. Explained to pt that there are pharmacy that do deliver medications. Provided information on dc instruction to call and have pharmacy establish. States she take public transportation to her appts. She plans to call her PCP and arrange a follow up appt. NCM did review with pt that [facility] has programs that may assist her with transportation. Provided that toll free number on her dc instructions.   PCP Deanna Denmark MD Expected Discharge Date:                Expected Discharge Plan:  Home/Self Care  In-House Referral:  Clinical Social Work  Discharge planning Services  CM Consult  Post Acute Care Choice:  NA Choice offered to:  NA  DME Arranged:  N/A DME Agency:  NA  HH Arranged:  NA HH Agency:  NA  Status of Service:  Completed, signed off  If discussed at Long Length of Stay Meetings, dates discussed:    Additional Comments:  Elliot Cousin, RN 04/22/2017, 9:36 AM

## 2017-04-22 NOTE — ED Triage Notes (Signed)
Per EMS pt states she has a history of COPD and asthma.  Pt awoke at 0300 with cough and increased SHOB.  Pt was ambulatory in driveway on scene in no acute distress at that time.  Pt has been given a total of 10 albuterol, 0.5 atrovent, and 125 solu medrol en route.  Pt has no pain or other complaints. Some expiratory wheeze that has improved with nebs.  VSS 134/78, HR 90, RR20, 100% on neb, CBG 88

## 2017-04-22 NOTE — ED Notes (Signed)
Pt's oxygen saturation fluctuated between 94% and 97% on room air while ambulating in hallway

## 2018-05-02 ENCOUNTER — Emergency Department (HOSPITAL_COMMUNITY)
Admission: EM | Admit: 2018-05-02 | Discharge: 2018-06-01 | Disposition: E | Payer: Medicare HMO | Attending: Emergency Medicine | Admitting: Emergency Medicine

## 2018-05-02 ENCOUNTER — Emergency Department (HOSPITAL_COMMUNITY): Payer: Medicare HMO

## 2018-05-02 ENCOUNTER — Encounter (HOSPITAL_COMMUNITY): Payer: Self-pay

## 2018-05-02 DIAGNOSIS — R231 Pallor: Secondary | ICD-10-CM | POA: Diagnosis not present

## 2018-05-02 DIAGNOSIS — I4510 Unspecified right bundle-branch block: Secondary | ICD-10-CM | POA: Diagnosis not present

## 2018-05-02 DIAGNOSIS — F79 Unspecified intellectual disabilities: Secondary | ICD-10-CM | POA: Insufficient documentation

## 2018-05-02 DIAGNOSIS — R000 Tachycardia, unspecified: Secondary | ICD-10-CM | POA: Diagnosis not present

## 2018-05-02 DIAGNOSIS — I472 Ventricular tachycardia: Secondary | ICD-10-CM

## 2018-05-02 DIAGNOSIS — Z4682 Encounter for fitting and adjustment of non-vascular catheter: Secondary | ICD-10-CM | POA: Diagnosis not present

## 2018-05-02 DIAGNOSIS — Z79899 Other long term (current) drug therapy: Secondary | ICD-10-CM | POA: Diagnosis not present

## 2018-05-02 DIAGNOSIS — R0789 Other chest pain: Secondary | ICD-10-CM | POA: Diagnosis not present

## 2018-05-02 DIAGNOSIS — I469 Cardiac arrest, cause unspecified: Secondary | ICD-10-CM | POA: Insufficient documentation

## 2018-05-02 DIAGNOSIS — I200 Unstable angina: Secondary | ICD-10-CM

## 2018-05-02 DIAGNOSIS — I4901 Ventricular fibrillation: Secondary | ICD-10-CM | POA: Insufficient documentation

## 2018-05-02 DIAGNOSIS — I214 Non-ST elevation (NSTEMI) myocardial infarction: Secondary | ICD-10-CM

## 2018-05-02 DIAGNOSIS — F17210 Nicotine dependence, cigarettes, uncomplicated: Secondary | ICD-10-CM | POA: Diagnosis not present

## 2018-05-02 DIAGNOSIS — R079 Chest pain, unspecified: Secondary | ICD-10-CM | POA: Diagnosis not present

## 2018-05-02 LAB — CBC WITH DIFFERENTIAL/PLATELET
ABS IMMATURE GRANULOCYTES: 0.4 10*3/uL — AB (ref 0.0–0.1)
BASOS PCT: 0 %
Basophils Absolute: 0.1 10*3/uL (ref 0.0–0.1)
EOS PCT: 1 %
Eosinophils Absolute: 0.1 10*3/uL (ref 0.0–0.7)
HCT: 45.7 % (ref 36.0–46.0)
Hemoglobin: 13.6 g/dL (ref 12.0–15.0)
Immature Granulocytes: 2 %
Lymphocytes Relative: 32 %
Lymphs Abs: 5.6 10*3/uL — ABNORMAL HIGH (ref 0.7–4.0)
MCH: 29.3 pg (ref 26.0–34.0)
MCHC: 29.8 g/dL — AB (ref 30.0–36.0)
MCV: 98.5 fL (ref 78.0–100.0)
MONO ABS: 0.9 10*3/uL (ref 0.1–1.0)
Monocytes Relative: 5 %
NEUTROS ABS: 10.3 10*3/uL — AB (ref 1.7–7.7)
Neutrophils Relative %: 60 %
PLATELETS: 217 10*3/uL (ref 150–400)
RBC: 4.64 MIL/uL (ref 3.87–5.11)
RDW: 14.3 % (ref 11.5–15.5)
WBC: 17.3 10*3/uL — ABNORMAL HIGH (ref 4.0–10.5)

## 2018-05-02 LAB — COMPREHENSIVE METABOLIC PANEL
ALBUMIN: 3.4 g/dL — AB (ref 3.5–5.0)
ALT: 57 U/L — ABNORMAL HIGH (ref 0–44)
AST: 104 U/L — ABNORMAL HIGH (ref 15–41)
Alkaline Phosphatase: 64 U/L (ref 38–126)
Anion gap: 20 — ABNORMAL HIGH (ref 5–15)
BUN: 13 mg/dL (ref 6–20)
CO2: 17 mmol/L — ABNORMAL LOW (ref 22–32)
Calcium: 10 mg/dL (ref 8.9–10.3)
Chloride: 105 mmol/L (ref 98–111)
Creatinine, Ser: 1.53 mg/dL — ABNORMAL HIGH (ref 0.44–1.00)
GFR calc Af Amer: 43 mL/min — ABNORMAL LOW (ref >60)
GFR calc non Af Amer: 37 mL/min — ABNORMAL LOW (ref >60)
GLUCOSE: 427 mg/dL — AB (ref 70–99)
Potassium: 3.8 mmol/L (ref 3.5–5.1)
Sodium: 142 mmol/L (ref 135–145)
Total Bilirubin: 0.8 mg/dL (ref 0.3–1.2)
Total Protein: 5.8 g/dL — ABNORMAL LOW (ref 6.5–8.1)

## 2018-05-02 LAB — I-STAT CHEM 8, ED
BUN: 13 mg/dL (ref 6–20)
Calcium, Ion: 1.26 mmol/L (ref 1.15–1.40)
Chloride: 106 mmol/L (ref 98–111)
Creatinine, Ser: 1.4 mg/dL — ABNORMAL HIGH (ref 0.44–1.00)
Glucose, Bld: 410 mg/dL — ABNORMAL HIGH (ref 70–99)
HEMATOCRIT: 44 % (ref 36.0–46.0)
HEMOGLOBIN: 15 g/dL (ref 12.0–15.0)
Potassium: 3.8 mmol/L (ref 3.5–5.1)
SODIUM: 143 mmol/L (ref 135–145)
TCO2: 17 mmol/L — AB (ref 22–32)

## 2018-05-02 LAB — I-STAT CG4 LACTIC ACID, ED: LACTIC ACID, VENOUS: 12.49 mmol/L — AB (ref 0.5–1.9)

## 2018-05-02 LAB — BRAIN NATRIURETIC PEPTIDE: B Natriuretic Peptide: 199.2 pg/mL — ABNORMAL HIGH (ref 0.0–100.0)

## 2018-05-02 LAB — I-STAT TROPONIN, ED: TROPONIN I, POC: 2.19 ng/mL — AB (ref 0.00–0.08)

## 2018-05-02 LAB — TSH: TSH: 4.995 u[IU]/mL — ABNORMAL HIGH (ref 0.350–4.500)

## 2018-05-02 LAB — T4, FREE: Free T4: 0.89 ng/dL (ref 0.82–1.77)

## 2018-05-02 MED ORDER — LIDOCAINE IN D5W 4-5 MG/ML-% IV SOLN
1.0000 mg/min | Freq: Once | INTRAVENOUS | Status: DC
  Filled 2018-05-02: qty 500

## 2018-05-02 MED ORDER — IOPAMIDOL (ISOVUE-370) INJECTION 76%
INTRAVENOUS | Status: AC
  Filled 2018-05-02: qty 100

## 2018-05-02 MED ORDER — FENTANYL CITRATE (PF) 100 MCG/2ML IJ SOLN: 100.0000 ug | INTRAMUSCULAR | Status: DC | PRN

## 2018-05-02 MED ORDER — NOREPINEPHRINE 4 MG/250ML-% IV SOLN: 0.0000 ug/min | Freq: Once | INTRAVENOUS | Status: DC

## 2018-05-02 MED ORDER — VANCOMYCIN HCL 10 G IV SOLR
1250.0000 mg | Freq: Once | INTRAVENOUS | Status: DC
  Filled 2018-05-02: qty 1250

## 2018-05-02 MED ORDER — MIDAZOLAM HCL 2 MG/2ML IJ SOLN: 2.0000 mg | INTRAMUSCULAR | Status: DC | PRN

## 2018-05-02 MED ORDER — SODIUM BICARBONATE 8.4 % IV SOLN
INTRAVENOUS | Status: AC | PRN
  Administered 2018-05-02: 50 meq via INTRAVENOUS

## 2018-05-02 MED ORDER — SODIUM CHLORIDE 0.9 % IV SOLN: INTRAVENOUS | Status: DC | PRN

## 2018-05-02 MED ORDER — MAGNESIUM SULFATE 2 GM/50ML IV SOLN: 2.0000 g | Freq: Once | INTRAVENOUS | Status: DC

## 2018-05-02 MED ORDER — SODIUM CHLORIDE 0.9 % IV SOLN
2.0000 g | Freq: Once | INTRAVENOUS | Status: DC
  Filled 2018-05-02: qty 2

## 2018-05-03 MED FILL — Medication: Qty: 1 | Status: AC

## 2018-06-01 NOTE — ED Triage Notes (Signed)
[facility] reports pt from hoarder type home with CP x2 days. 10/10 on arrival. [facility] reports pt pale, cool, clammy and actively vomiting. Pt given 324  ASA, 250 ml NS, and 4 mg zofran 7/10 pain  No NTG due to possible depressions on ekg. Pt has a BBB. Pt does not see doc or take meds.
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
## 2018-06-01 NOTE — Progress Notes (Signed)
   2018-05-12 1800  Clinical Encounter Type  Visited With Family;Patient not available  Visit Type ED;Critical Care;Code  Referral From Nurse  Spiritual Encounters  Spiritual Needs Emotional  CH met with family and escorted family to waiting area outside of patient room; Euharlee present during MD briefing for significant other; Franklin will continue to monitor and offer emotional support as needed. Gwynn Burly 6:14 PM

## 2018-06-01 NOTE — ED Notes (Signed)
Two IV team members attempted to get IV and blood. One IV, but no blood return. Phlebotomy, nurses, techs have attempted blood draws. Phlebotomy will return to draw blood.
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
## 2018-06-01 NOTE — Code Documentation (Signed)
Epi drip up at 10, significant other being spoke to by MD-CPR ongoing

## 2018-06-01 NOTE — ED Notes (Signed)
Family/primary contact at bedside. Requested to be alone at this time. Chaplain aware.
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
## 2018-06-01 NOTE — ED Notes (Signed)
Two unsuccessful IV attempts. Requesting assistance from phlebotomy and placed IV consult.

## 2018-06-01 NOTE — ED Notes (Signed)
Called [facility] to activate code cool protocol per Dr. Erma Heritage

## 2018-06-01 NOTE — ED Notes (Signed)
Time out completed. Dr Erma Heritage at bedside inserting central line.

## 2018-06-01 NOTE — ED Notes (Signed)
Paged PCCM per Verbal from MD Erma Heritage

## 2018-06-01 NOTE — ED Notes (Signed)
Placed Artic sun pad on pt. Pt temp at 34.9. Cooling not initiated at this time per Dr. Alyce Pagan.

## 2018-06-01 NOTE — ED Notes (Addendum)
Dr Clarene Duke inserted an EJ for access in pt. Shortly, thereafter she went into vfib and required shocks. Dr Erma Heritage joined the code and preceded to intubate the patient. The following is an account of the meds given during multiple code events.  1609 300mg  amiodarone 1615 15 mg atomidate 1616 80 mg roc 1617 15 mg atomidate in EJ  1630 1 mg epi 1644 1 mg epi 1645 1mg  epi 1648 IV Levophed hung at 10 mcg 1659 Levophed increased to 20 mcg 1659 1 mg epi  1700 Lidocaine hung 50 mg bolus given  1715 Levo increased to 25 mg 1719 1 mg epi 1722 bicarb given 1724 Levo increased to 35 mcg 1724 1 mg epi 1751 1 mg epi 1753 bicarb given 1755 1 mg epi Epi drip hung at 10 mg  1756 pulse check 1801 Time of death

## 2018-06-01 NOTE — ED Notes (Signed)
This tech attempted blood x2, unsuccessfully. MD aware. Phlebotomy notified.

## 2018-06-01 NOTE — ED Notes (Signed)
Chaplain gave pt belongings in pt belonging bag to Spouse.

## 2018-06-01 NOTE — ED Notes (Signed)
PAGED STEMI MD PER MD LITTLE

## 2018-06-01 DEATH — deceased

## 2018-12-03 IMAGING — DX DG CHEST 1V PORT
1 series · 1 of 1 positions shown · non-contrast
Comparison: Chest radiograph May 02, 2018 at 1920 hours

CLINICAL DATA: Chest pain for 2 days.  Vomiting.

EXAM:
PORTABLE CHEST 1 VIEW

[chest]
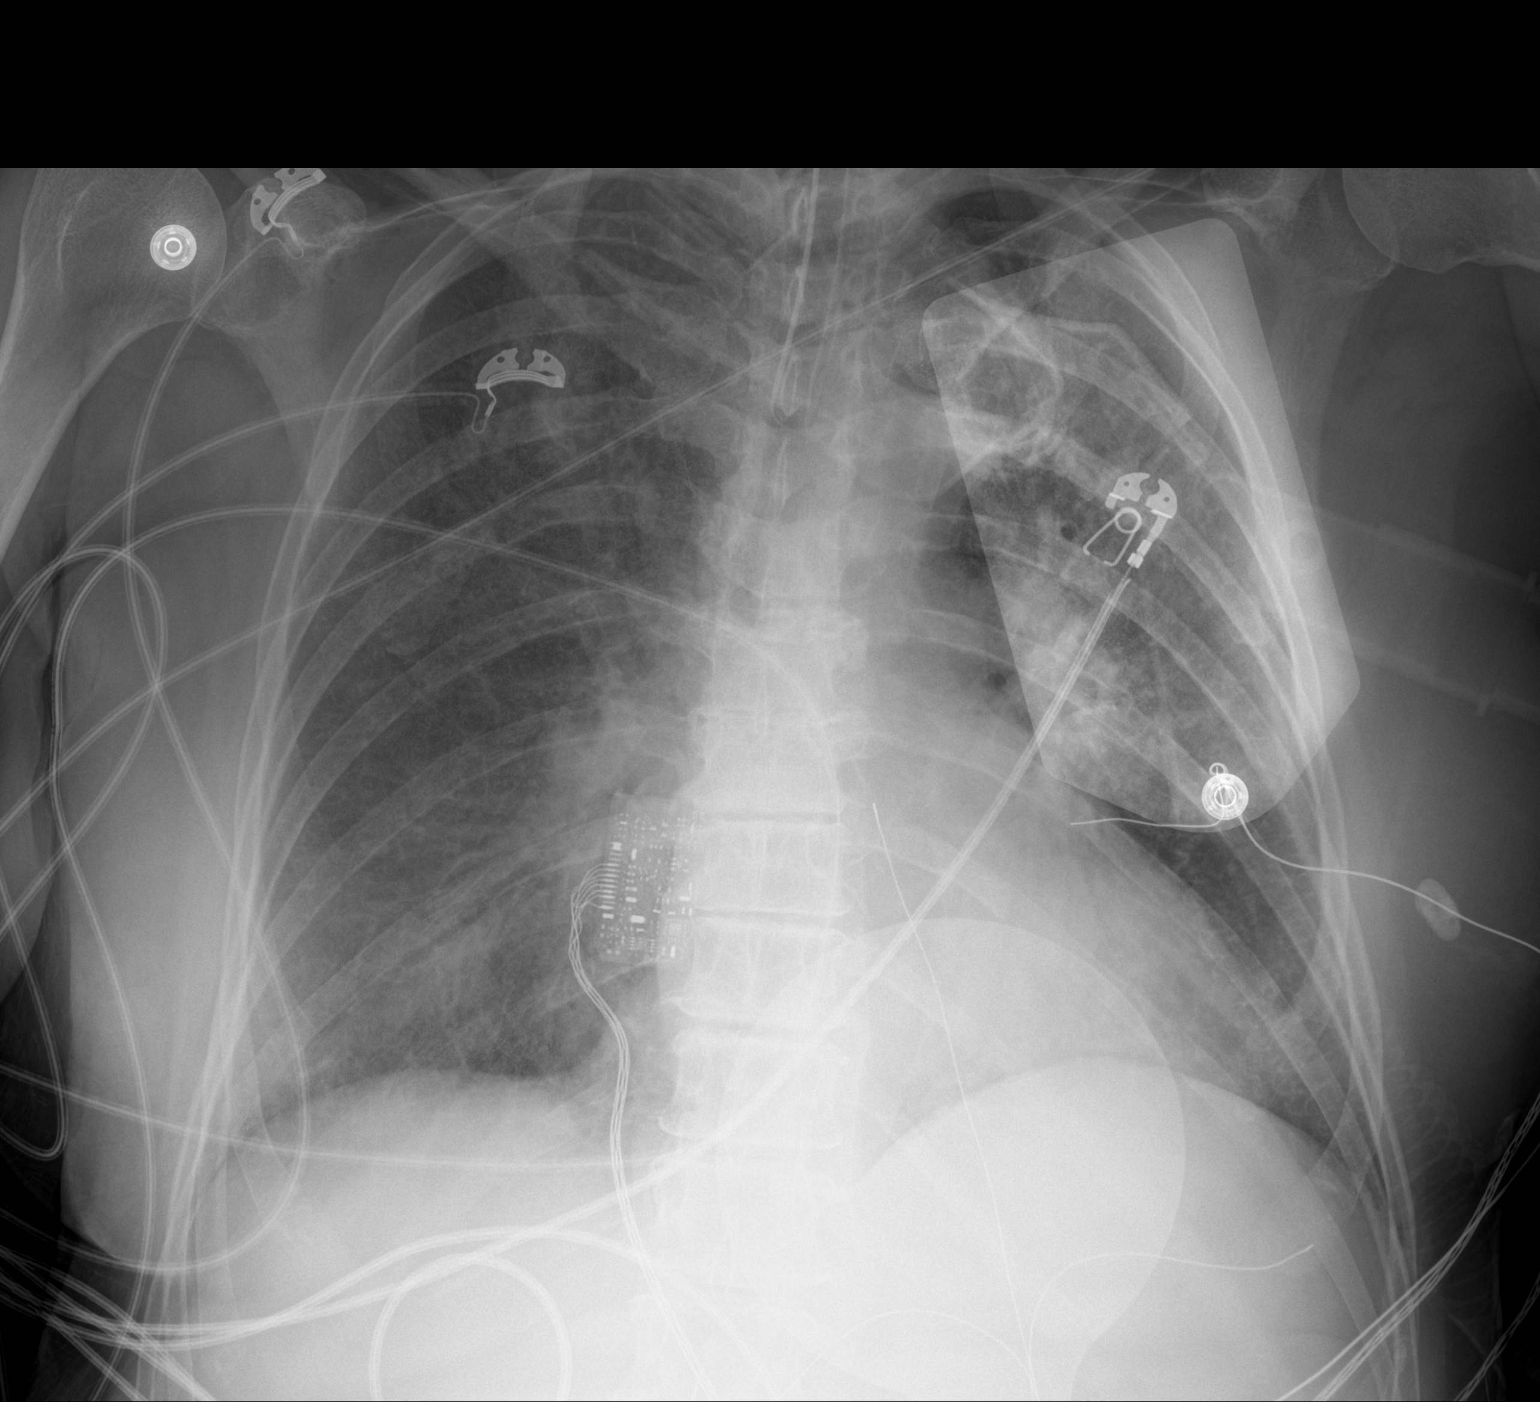

[1 of 1 positions shown; findings below may reference images not displayed]

FINDINGS: Endotracheal tube tip projects 2.9 cm above the carina. Multiple EKG
leads in pacer pads overlie patient.

Stable cardiomegaly. Pulmonary vascular congestion and interstitial
prominence with patchy RIGHT upper lobe and RIGHT lung base airspace
opacity. No pleural effusion. No pneumothorax. Soft tissue planes
and included osseous structures are unchanged.
IMPRESSION: 1. Endotracheal tube tip projects 2.9 cm above the carina.
2. Cardiomegaly, increasing interstitial prominence most compatible
with pulmonary edema confluent in RIGHT upper and RIGHT lung base.
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
